# Patient Record
Sex: Female | Born: 1995 | Race: Black or African American | Hispanic: No | Marital: Married | Smoking: Never smoker
Health system: Southern US, Community
[De-identification: ages and names within clinical notes are randomized; demographics above are authoritative.]

## PROBLEM LIST (undated history)

## (undated) DIAGNOSIS — J45909 Unspecified asthma, uncomplicated: Secondary | ICD-10-CM

## (undated) HISTORY — DX: Unspecified asthma, uncomplicated: J45.909

---
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
## 2021-03-10 ENCOUNTER — Ambulatory Visit
Admission: EM | Admit: 2021-03-10 | Discharge: 2021-03-10 | Disposition: A | Discharge status: Home / Self Care | Payer: BC Managed Care – PPO

## 2021-03-10 ENCOUNTER — Other Ambulatory Visit: Payer: Self-pay

## 2021-03-10 DIAGNOSIS — J069 Acute upper respiratory infection, unspecified: Secondary | ICD-10-CM

## 2021-03-10 LAB — POCT RAPID STREP A (OFFICE): Rapid Strep A Screen: NEGATIVE

## 2021-03-10 MED ORDER — ALBUTEROL SULFATE HFA 108 (90 BASE) MCG/ACT IN AERS: 1.0000 | INHALATION_SPRAY | Freq: Four times a day (QID) | RESPIRATORY_TRACT | 0 refills | Status: AC | PRN

## 2021-03-10 NOTE — Discharge Instructions (Signed)
Recommend Flonase and Delsym for cough Can use albuterol inhaler as needed Drink plenty of fluids Return if symptoms become worse

## 2021-03-10 NOTE — ED Triage Notes (Signed)
Pt c/o sore throat, cough, chills, headaches, wheezing, and chest congestion since Friday. States neg home covid test. States received her flu shot on 10/13.

## 2021-03-10 NOTE — ED Provider Notes (Addendum)
[facility]    CSN: [id number] Arrival date & time: 03/10/21  1815      History   Chief Complaint Chief Complaint  Patient presents with   Sore Throat    HPI Annette Morgan is a 25 y.o. female.   Pt complains of congestion, cough, sore throat, headache that started four days ago.  She has a history of asthma and reports some chest tightness with coughing. She has taken a home covid test which was negative.  She has had her flu shot.  She is currently taking tylenol cold and flu with minimal improvement.    History reviewed. No pertinent past medical history.  There are no problems to display for this patient.   History reviewed. No pertinent surgical history.  OB History   No obstetric history on file.      Home Medications    Prior to Admission medications   Medication Sig Start Date End Date Taking? Authorizing Provider  cetirizine (ZYRTEC) 5 MG tablet Take by mouth.    [provider]  naproxen (NAPROSYN) 500 MG tablet Take 1 tablet (500 mg total) by mouth 2 (two) times daily with a meal. 09/27/12   Eber Hong, MD    Family History History reviewed. No pertinent family history.  Social History Social History   Tobacco Use   Smoking status: Never   Smokeless tobacco: Never  Substance Use Topics   Alcohol use: No   Drug use: No     Allergies   Patient has no known allergies.   Review of Systems Review of Systems  Constitutional:  Negative for chills and fever.  HENT:  Positive for congestion and sore throat. Negative for ear pain.   Eyes:  Negative for pain and visual disturbance.  Respiratory:  Positive for cough. Negative for shortness of breath.   Cardiovascular:  Negative for chest pain and palpitations.  Gastrointestinal:  Negative for abdominal pain and vomiting.  Genitourinary:  Negative for dysuria and hematuria.  Musculoskeletal:  Negative for arthralgias and back pain.  Skin:  Negative for color change and  rash.  Neurological:  Negative for seizures and syncope.  All other systems reviewed and are negative.   Physical Exam Triage Vital Signs ED Triage Vitals  Enc Vitals Group     BP 03/10/21 1909 (!) 122/53     Pulse Rate 03/10/21 1909 88     Resp 03/10/21 1909 18     Temp 03/10/21 1909 98.5 F (36.9 C)     Temp Source 03/10/21 1909 Oral     SpO2 03/10/21 1909 100 %     Weight --      Height --      Head Circumference --      Peak Flow --      Pain Score 03/10/21 1910 5     Pain Loc --      Pain Edu? --      Excl. in GC? --    No data found.  Updated Vital Signs BP (!) 122/53 (BP Location: Left Arm)   Pulse 88   Temp 98.5 F (36.9 C) (Oral)   Resp 18   LMP 03/07/2021   SpO2 100%   Visual Acuity Right Eye Distance:   Left Eye Distance:   Bilateral Distance:    Right Eye Near:   Left Eye Near:    Bilateral Near:     Physical Exam Vitals and nursing note reviewed.  Constitutional:  General: She is not in acute distress.    Appearance: She is well-developed.  HENT:     Head: Normocephalic and atraumatic.  Eyes:     Conjunctiva/sclera: Conjunctivae normal.  Cardiovascular:     Rate and Rhythm: Normal rate and regular rhythm.     Heart sounds: No murmur heard. Pulmonary:     Effort: Pulmonary effort is normal. No respiratory distress.     Breath sounds: Normal breath sounds.  Abdominal:     Palpations: Abdomen is soft.     Tenderness: There is no abdominal tenderness.  Musculoskeletal:     Cervical back: Neck supple.  Skin:    General: Skin is warm and dry.  Neurological:     Mental Status: She is alert.     UC Treatments / Results  Labs (all labs ordered are listed, but only abnormal results are displayed) Labs Reviewed  POCT RAPID STREP A (OFFICE)    EKG   Radiology No results found.  Procedures Procedures (including critical care time)  Medications Ordered in UC Medications - No data to display  Initial Impression / Assessment  and Plan / UC Course  I have reviewed the triage vital signs and the nursing notes.  Pertinent labs & imaging results that were available during my care of the patient were reviewed by me and considered in my medical decision making (see chart for details).     URI, discussed supportive care.  Albuterol inhaler prescribed due to h/o asthma.  Return precautions discussed.  Final Clinical Impressions(s) / UC Diagnoses   Final diagnoses:  None   Discharge Instructions   None    ED Prescriptions   None    PDMP not reviewed this encounter.   Ward, Tylene Fantasia, PA-C 03/10/21 1937    Ward, Tylene Fantasia, PA-C 03/10/21 1942

## 2021-03-12 LAB — COVID-19, FLU A+B NAA
Influenza A, NAA: NOT DETECTED
Influenza B, NAA: NOT DETECTED
SARS-CoV-2, NAA: NOT DETECTED

## 2022-03-25 ENCOUNTER — Ambulatory Visit
Admission: EM | Admit: 2022-03-25 | Discharge: 2022-03-25 | Disposition: A | Discharge status: Home / Self Care | Payer: Commercial Managed Care - PPO | Attending: Internal Medicine | Admitting: Internal Medicine

## 2022-03-25 DIAGNOSIS — J45909 Unspecified asthma, uncomplicated: Secondary | ICD-10-CM | POA: Diagnosis not present

## 2022-03-25 DIAGNOSIS — Z1152 Encounter for screening for COVID-19: Secondary | ICD-10-CM | POA: Diagnosis not present

## 2022-03-25 DIAGNOSIS — J069 Acute upper respiratory infection, unspecified: Secondary | ICD-10-CM | POA: Diagnosis present

## 2022-03-25 DIAGNOSIS — R058 Other specified cough: Secondary | ICD-10-CM | POA: Insufficient documentation

## 2022-03-25 NOTE — Discharge Instructions (Signed)
It appears that you have a viral illness that should run its course and self resolve as we discussed.  Please follow-up if symptoms persist or worsen.  COVID and flu test are pending.  We will call if they are positive.

## 2022-03-25 NOTE — ED Provider Notes (Signed)
[facility]    CSN: [id number] Arrival date & time: 03/25/22  1757      History   Chief Complaint Chief Complaint  Patient presents with   Fatigue    HPI Annette Morgan is a 26 y.o. female.   Patient presents with nasal congestion, cough, fatigue, body aches, chills, fever that started yesterday.  Tmax at home was 100.8.  She has taken Tylenol cold and flu with minimal improvement of symptoms.  Denies chest pain, shortness of breath, sore throat, ear pain, nausea, vomiting, diarrhea, abdominal pain.  Patient denies any known sick contacts.     Past Medical History:  Diagnosis Date   Asthma     There are no problems to display for this patient.   History reviewed. No pertinent surgical history.  OB History   No obstetric history on file.      Home Medications    Prior to Admission medications   Medication Sig Start Date End Date Taking? Authorizing Provider  albuterol (VENTOLIN HFA) 108 (90 Base) MCG/ACT inhaler Inhale 1-2 puffs into the lungs every 6 (six) hours as needed for wheezing or shortness of breath. 03/10/21   Ward, Tylene Fantasia, PA-C  cetirizine (ZYRTEC) 5 MG tablet Take by mouth.    [provider]  naproxen (NAPROSYN) 500 MG tablet Take 1 tablet (500 mg total) by mouth 2 (two) times daily with a meal. 09/27/12   Eber Hong, MD    Family History History reviewed. No pertinent family history.  Social History Social History   Tobacco Use   Smoking status: Never   Smokeless tobacco: Never  Substance Use Topics   Alcohol use: No   Drug use: No     Allergies   Patient has no known allergies.   Review of Systems Review of Systems Per HPI  Physical Exam Triage Vital Signs ED Triage Vitals [03/25/22 1828]  Enc Vitals Group     BP 130/80     Pulse Rate 100     Resp 16     Temp 98.5 F (36.9 C)     Temp Source Oral     SpO2 99 %     Weight      Height      Head Circumference      Peak Flow      Pain Score 0      Pain Loc      Pain Edu?      Excl. in GC?    No data found.  Updated Vital Signs BP 130/80 (BP Location: Left Arm)   Pulse 100   Temp 98.5 F (36.9 C) (Oral)   Resp 16   SpO2 99%   Visual Acuity Right Eye Distance:   Left Eye Distance:   Bilateral Distance:    Right Eye Near:   Left Eye Near:    Bilateral Near:     Physical Exam Constitutional:      General: She is not in acute distress.    Appearance: Normal appearance. She is not toxic-appearing or diaphoretic.  HENT:     Head: Normocephalic and atraumatic.     Right Ear: Tympanic membrane and ear canal normal.     Left Ear: Tympanic membrane and ear canal normal.     Nose: Congestion present.     Mouth/Throat:     Mouth: Mucous membranes are moist.     Pharynx: No posterior oropharyngeal erythema.  Eyes:     Extraocular Movements: Extraocular movements intact.  Conjunctiva/sclera: Conjunctivae normal.     Pupils: Pupils are equal, round, and reactive to light.  Cardiovascular:     Rate and Rhythm: Normal rate and regular rhythm.     Pulses: Normal pulses.     Heart sounds: Normal heart sounds.  Pulmonary:     Effort: Pulmonary effort is normal. No respiratory distress.     Breath sounds: Normal breath sounds. No stridor. No wheezing, rhonchi or rales.  Abdominal:     General: Abdomen is flat. Bowel sounds are normal.     Palpations: Abdomen is soft.  Musculoskeletal:        General: Normal range of motion.     Cervical back: Normal range of motion.  Skin:    General: Skin is warm and dry.  Neurological:     General: No focal deficit present.     Mental Status: She is alert and oriented to person, place, and time. Mental status is at baseline.  Psychiatric:        Mood and Affect: Mood normal.        Behavior: Behavior normal.      UC Treatments / Results  Labs (all labs ordered are listed, but only abnormal results are displayed) Labs Reviewed  RESP PANEL BY RT-PCR (FLU A&B, COVID) [id number]     EKG   Radiology No results found.  Procedures Procedures (including critical care time)  Medications Ordered in UC Medications - No data to display  Initial Impression / Assessment and Plan / UC Course  I have reviewed the triage vital signs and the nursing notes.  Pertinent labs & imaging results that were available during my care of the patient were reviewed by me and considered in my medical decision making (see chart for details).     Patient presents with symptoms likely from a viral upper respiratory infection. Differential includes bacterial pneumonia, sinusitis, allergic rhinitis, covid 19, flu, RSV. Do not suspect underlying cardiopulmonary process. Symptoms seem unlikely related to ACS, CHF or COPD exacerbations, pneumonia, pneumothorax. Patient is nontoxic appearing and not in need of emergent medical intervention. Viral testing pending.   Recommended symptom control with over the counter medications. Discussed supportive care and symptom management with patient.   Return if symptoms fail to improve in 1-2 weeks or you develop shortness of breath, chest pain, severe headache. Patient states understanding and is agreeable.  Discharged with PCP followup.  Final Clinical Impressions(s) / UC Diagnoses   Final diagnoses:  Viral upper respiratory tract infection with cough     Discharge Instructions      It appears that you have a viral illness that should run its course and self resolve as we discussed.  Please follow-up if symptoms persist or worsen.  COVID and flu test are pending.  We will call if they are positive.    ED Prescriptions   None    PDMP not reviewed this encounter.   Gustavus Bryant, [place] 03/25/22 1911

## 2022-03-25 NOTE — ED Triage Notes (Signed)
Pt c/o malaise that started ~ Monday. Reports fever at home. Associated cough, body aches, chills.

## 2022-03-26 LAB — RESP PANEL BY RT-PCR (FLU A&B, COVID) ARPGX2
Influenza A by PCR: NEGATIVE
Influenza B by PCR: NEGATIVE
SARS Coronavirus 2 by RT PCR: NEGATIVE
